# Patient Record
Sex: Male | Born: 2000
Health system: Southern US, Community
[De-identification: ages and names within clinical notes are randomized; demographics above are authoritative.]

## PROBLEM LIST (undated history)

## (undated) DIAGNOSIS — F909 Attention-deficit hyperactivity disorder, unspecified type: Secondary | ICD-10-CM

## (undated) HISTORY — PX: TUMOR REMOVAL: SHX12

---
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
## 2020-11-18 ENCOUNTER — Other Ambulatory Visit (HOSPITAL_COMMUNITY): Payer: Self-pay | Admitting: Family Medicine

## 2020-11-18 MED FILL — metroNIDAZOLE 500 MG TABS: 500 | 1 days supply | Qty: 4 | Fill #0

## 2021-01-18 ENCOUNTER — Observation Stay (HOSPITAL_COMMUNITY)
Admission: EM | Admit: 2021-01-18 | Discharge: 2021-01-20 | Disposition: A | Discharge status: Home / Self Care | Payer: Medicaid Other | Attending: General Surgery | Admitting: General Surgery

## 2021-01-18 ENCOUNTER — Other Ambulatory Visit: Payer: Self-pay

## 2021-01-18 ENCOUNTER — Encounter (HOSPITAL_COMMUNITY): Payer: Self-pay

## 2021-01-18 ENCOUNTER — Emergency Department (HOSPITAL_COMMUNITY): Payer: Medicaid Other

## 2021-01-18 DIAGNOSIS — S41132A Puncture wound without foreign body of left upper arm, initial encounter: Principal | ICD-10-CM | POA: Insufficient documentation

## 2021-01-18 DIAGNOSIS — Z20822 Contact with and (suspected) exposure to covid-19: Secondary | ICD-10-CM | POA: Insufficient documentation

## 2021-01-18 DIAGNOSIS — Z23 Encounter for immunization: Secondary | ICD-10-CM | POA: Diagnosis not present

## 2021-01-18 DIAGNOSIS — S31133A Puncture wound of abdominal wall without foreign body, right lower quadrant without penetration into peritoneal cavity, initial encounter: Secondary | ICD-10-CM | POA: Diagnosis not present

## 2021-01-18 DIAGNOSIS — S3289XA Fracture of other parts of pelvis, initial encounter for closed fracture: Secondary | ICD-10-CM | POA: Insufficient documentation

## 2021-01-18 DIAGNOSIS — Y900 Blood alcohol level of less than 20 mg/100 ml: Secondary | ICD-10-CM | POA: Diagnosis not present

## 2021-01-18 DIAGNOSIS — W3400XA Accidental discharge from unspecified firearms or gun, initial encounter: Secondary | ICD-10-CM | POA: Diagnosis not present

## 2021-01-18 DIAGNOSIS — T1490XA Injury, unspecified, initial encounter: Secondary | ICD-10-CM

## 2021-01-18 LAB — CBC
HCT: 38.8 % — ABNORMAL LOW (ref 39.0–52.0)
Hemoglobin: 11.9 g/dL — ABNORMAL LOW (ref 13.0–17.0)
MCH: 23.5 pg — ABNORMAL LOW (ref 26.0–34.0)
MCHC: 30.7 g/dL (ref 30.0–36.0)
MCV: 76.5 fL — ABNORMAL LOW (ref 80.0–100.0)
Platelets: 235 10*3/uL (ref 150–400)
RBC: 5.07 MIL/uL (ref 4.22–5.81)
RDW: 16.6 % — ABNORMAL HIGH (ref 11.5–15.5)
WBC: 14 10*3/uL — ABNORMAL HIGH (ref 4.0–10.5)
nRBC: 0 % (ref 0.0–0.2)

## 2021-01-18 LAB — URINALYSIS, ROUTINE W REFLEX MICROSCOPIC
Bilirubin Urine: NEGATIVE
Glucose, UA: NEGATIVE mg/dL
Hgb urine dipstick: NEGATIVE
Ketones, ur: NEGATIVE mg/dL
Nitrite: NEGATIVE
Protein, ur: 100 mg/dL — AB
Specific Gravity, Urine: >1.046 — ABNORMAL HIGH (ref 1.005–1.030)
pH: 8 (ref 5.0–8.0)

## 2021-01-18 LAB — COMPREHENSIVE METABOLIC PANEL
ALT: 14 U/L (ref 0–44)
AST: 33 U/L (ref 15–41)
Albumin: 3.8 g/dL (ref 3.5–5.0)
Alkaline Phosphatase: 67 U/L (ref 38–126)
Anion gap: 14 (ref 5–15)
BUN: 9 mg/dL (ref 6–20)
CO2: 19 mmol/L — ABNORMAL LOW (ref 22–32)
Calcium: 8.8 mg/dL — ABNORMAL LOW (ref 8.9–10.3)
Chloride: 109 mmol/L (ref 98–111)
Creatinine, Ser: 1.19 mg/dL (ref 0.61–1.24)
GFR, Estimated: >60 mL/min (ref >60)
Glucose, Bld: 145 mg/dL — ABNORMAL HIGH (ref 70–99)
Potassium: 3.9 mmol/L (ref 3.5–5.1)
Sodium: 142 mmol/L (ref 135–145)
Total Bilirubin: 1 mg/dL (ref 0.3–1.2)
Total Protein: 6.5 g/dL (ref 6.5–8.1)

## 2021-01-18 LAB — I-STAT CHEM 8, ED
BUN: 9 mg/dL (ref 6–20)
Calcium, Ion: 1.11 mmol/L — ABNORMAL LOW (ref 1.15–1.40)
Chloride: 107 mmol/L (ref 98–111)
Creatinine, Ser: 1 mg/dL (ref 0.61–1.24)
Glucose, Bld: 142 mg/dL — ABNORMAL HIGH (ref 70–99)
HCT: 40 % (ref 39.0–52.0)
Hemoglobin: 13.6 g/dL (ref 13.0–17.0)
Potassium: 4.1 mmol/L (ref 3.5–5.1)
Sodium: 144 mmol/L (ref 135–145)
TCO2: 21 mmol/L — ABNORMAL LOW (ref 22–32)

## 2021-01-18 LAB — PROTIME-INR
INR: 1.1 (ref 0.8–1.2)
Prothrombin Time: 14.3 seconds (ref 11.4–15.2)

## 2021-01-18 LAB — RESP PANEL BY RT-PCR (FLU A&B, COVID) ARPGX2
Influenza A by PCR: NEGATIVE
Influenza B by PCR: NEGATIVE
SARS Coronavirus 2 by RT PCR: NEGATIVE

## 2021-01-18 LAB — SAMPLE TO BLOOD BANK

## 2021-01-18 LAB — LACTIC ACID, PLASMA: Lactic Acid, Venous: 9.2 mmol/L (ref 0.5–1.9)

## 2021-01-18 LAB — ETHANOL: Alcohol, Ethyl (B): <10 mg/dL (ref <10)

## 2021-01-18 MED ORDER — LACTATED RINGERS IV BOLUS
1000.0000 mL | Freq: Once | INTRAVENOUS | Status: AC
  Administered 2021-01-18: 1000 mL via INTRAVENOUS

## 2021-01-18 MED ORDER — ONDANSETRON 4 MG PO TBDP: 4.0000 mg | ORAL_TABLET | Freq: Four times a day (QID) | ORAL | Status: DC | PRN

## 2021-01-18 MED ORDER — IOHEXOL 300 MG/ML  SOLN
100.0000 mL | Freq: Once | INTRAMUSCULAR | Status: AC | PRN
  Administered 2021-01-18: 100 mL via INTRAVENOUS

## 2021-01-18 MED ORDER — CEFAZOLIN SODIUM-DEXTROSE 2-4 GM/100ML-% IV SOLN
2.0000 g | Freq: Once | INTRAVENOUS | Status: AC
  Administered 2021-01-18: 2 g via INTRAVENOUS
  Filled 2021-01-18: qty 100

## 2021-01-18 MED ORDER — OXYCODONE HCL 5 MG PO TABS
5.0000 mg | ORAL_TABLET | Freq: Four times a day (QID) | ORAL | Status: DC | PRN
  Administered 2021-01-18 – 2021-01-20 (×4): 10 mg via ORAL
  Filled 2021-01-18 (×4): qty 2

## 2021-01-18 MED ORDER — MORPHINE SULFATE (PF) 2 MG/ML IV SOLN: 1.0000 mg | INTRAVENOUS | Status: DC | PRN

## 2021-01-18 MED ORDER — FENTANYL CITRATE (PF) 100 MCG/2ML IJ SOLN
50.0000 ug | Freq: Once | INTRAMUSCULAR | Status: AC
  Administered 2021-01-18: 50 ug via INTRAVENOUS
  Filled 2021-01-18: qty 2

## 2021-01-18 MED ORDER — LACTATED RINGERS IV SOLN: INTRAVENOUS | Status: DC

## 2021-01-18 MED ORDER — ENOXAPARIN SODIUM 30 MG/0.3ML IJ SOSY
30.0000 mg | PREFILLED_SYRINGE | Freq: Two times a day (BID) | INTRAMUSCULAR | Status: DC
  Administered 2021-01-20: 30 mg via SUBCUTANEOUS
  Filled 2021-01-18: qty 0.3

## 2021-01-18 MED ORDER — TETANUS-DIPHTH-ACELL PERTUSSIS 5-2.5-18.5 LF-MCG/0.5 IM SUSY
0.5000 mL | PREFILLED_SYRINGE | Freq: Once | INTRAMUSCULAR | Status: AC
  Administered 2021-01-18: 0.5 mL via INTRAMUSCULAR
  Filled 2021-01-18: qty 0.5

## 2021-01-18 MED ORDER — DOCUSATE SODIUM 100 MG PO CAPS
100.0000 mg | ORAL_CAPSULE | Freq: Two times a day (BID) | ORAL | Status: DC
  Administered 2021-01-19 – 2021-01-20 (×3): 100 mg via ORAL
  Filled 2021-01-18 (×3): qty 1

## 2021-01-18 MED ORDER — ACETAMINOPHEN 325 MG PO TABS: 650.0000 mg | ORAL_TABLET | ORAL | Status: DC | PRN

## 2021-01-18 MED ORDER — ONDANSETRON HCL 4 MG/2ML IJ SOLN
4.0000 mg | Freq: Four times a day (QID) | INTRAMUSCULAR | Status: DC | PRN
  Filled 2021-01-18: qty 2
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
## 2021-01-18 NOTE — ED Notes (Signed)
Pt back from CT
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
## 2021-01-18 NOTE — ED Notes (Signed)
Pt has metal necklace in urine cup at bedside.

## 2021-01-18 NOTE — TOC CAGE-AID Note (Signed)
Transition of Care [facility]) - CAGE-AID Screening   Patient Details  Name: Ian [name] MRN: [id number] Date of Birth: May 10, 2001  Clinical Narrative:  Patient states he does use marijuana most days but denies any current alcohol use. He states he does not need any substance abuse resources at this time.  CAGE-AID Screening:    Have You Ever Felt You Ought to Cut Down on Your Drinking or Drug Use?: No Have People Annoyed You By Critizing Your Drinking Or Drug Use?: No Have You Felt Bad Or Guilty About Your Drinking Or Drug Use?: No Have You Ever Had a Drink or Used Drugs First Thing In The Morning to Steady Your Nerves or to Get Rid of a Hangover?: No CAGE-AID Score: 0  Substance Abuse Education Offered: Yes (patient denies need for education)
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
## 2021-01-19 LAB — CBC
HCT: 34.2 % — ABNORMAL LOW (ref 39.0–52.0)
HCT: 31.3 % — ABNORMAL LOW (ref 39.0–52.0)
Hemoglobin: 10.7 g/dL — ABNORMAL LOW (ref 13.0–17.0)
Hemoglobin: 9.9 g/dL — ABNORMAL LOW (ref 13.0–17.0)
MCH: 23.3 pg — ABNORMAL LOW (ref 26.0–34.0)
MCH: 23.6 pg — ABNORMAL LOW (ref 26.0–34.0)
MCHC: 31.3 g/dL (ref 30.0–36.0)
MCHC: 31.6 g/dL (ref 30.0–36.0)
MCV: 74.5 fL — ABNORMAL LOW (ref 80.0–100.0)
MCV: 74.7 fL — ABNORMAL LOW (ref 80.0–100.0)
Platelets: 188 10*3/uL (ref 150–400)
Platelets: 167 10*3/uL (ref 150–400)
RBC: 4.59 MIL/uL (ref 4.22–5.81)
RBC: 4.19 MIL/uL — ABNORMAL LOW (ref 4.22–5.81)
RDW: 16 % — ABNORMAL HIGH (ref 11.5–15.5)
RDW: 15.9 % — ABNORMAL HIGH (ref 11.5–15.5)
WBC: 15.1 10*3/uL — ABNORMAL HIGH (ref 4.0–10.5)
WBC: 10.8 10*3/uL — ABNORMAL HIGH (ref 4.0–10.5)
nRBC: 0 % (ref 0.0–0.2)
nRBC: 0 % (ref 0.0–0.2)

## 2021-01-19 LAB — BASIC METABOLIC PANEL
Anion gap: 8 (ref 5–15)
BUN: 7 mg/dL (ref 6–20)
CO2: 28 mmol/L (ref 22–32)
Calcium: 8.7 mg/dL — ABNORMAL LOW (ref 8.9–10.3)
Chloride: 105 mmol/L (ref 98–111)
Creatinine, Ser: 0.86 mg/dL (ref 0.61–1.24)
GFR, Estimated: >60 mL/min (ref >60)
Glucose, Bld: 127 mg/dL — ABNORMAL HIGH (ref 70–99)
Potassium: 3.6 mmol/L (ref 3.5–5.1)
Sodium: 141 mmol/L (ref 135–145)

## 2021-01-19 LAB — HIV ANTIBODY (ROUTINE TESTING W REFLEX): HIV Screen 4th Generation wRfx: NONREACTIVE

## 2021-01-19 MED ORDER — SODIUM CHLORIDE 0.9 % IV SOLN: INTRAVENOUS | Status: DC

## 2021-01-19 MED ORDER — ACETAMINOPHEN 500 MG PO TABS
1000.0000 mg | ORAL_TABLET | Freq: Four times a day (QID) | ORAL | Status: DC
  Administered 2021-01-19 – 2021-01-20 (×4): 1000 mg via ORAL
  Filled 2021-01-19 (×4): qty 2

## 2021-01-19 MED ORDER — POTASSIUM CHLORIDE IN NACL 20-0.9 MEQ/L-% IV SOLN
INTRAVENOUS | Status: DC
  Filled 2021-01-19 (×2): qty 1000
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
## 2021-01-19 NOTE — Progress Notes (Signed)
Attempted PIV x 2 with no success. Large veins with NBR. Pt states he is receiving oral pain meds. Abx looks completed on the [facility] with no IVF ordered. Spoke with primary RN and explained the pt has no IV meds ordered ,and to place a consult if the need for PIV arises.

## 2021-01-19 NOTE — Plan of Care (Signed)
  Problem: Health Behavior/Discharge Planning: Goal: Ability to manage health-related needs will improve Outcome: Progressing   Problem: Activity: Goal: Risk for activity intolerance will decrease Outcome: Progressing   Problem: Skin Integrity: Goal: Risk for impaired skin integrity will decrease Outcome: Progressing   
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
## 2021-01-19 NOTE — Plan of Care (Signed)

## 2021-01-19 NOTE — Plan of Care (Signed)
  Problem: Activity: Goal: Risk for activity intolerance will decrease Outcome: Progressing   Problem: Coping: Goal: Level of anxiety will decrease Outcome: Progressing   Problem: Safety: Goal: Ability to remain free from injury will improve Outcome: Progressing   Problem: Skin Integrity: Goal: Risk for impaired skin integrity will decrease Outcome: Progressing   

## 2021-01-20 LAB — CBC
HCT: 32.6 % — ABNORMAL LOW (ref 39.0–52.0)
Hemoglobin: 10.3 g/dL — ABNORMAL LOW (ref 13.0–17.0)
MCH: 23.4 pg — ABNORMAL LOW (ref 26.0–34.0)
MCHC: 31.6 g/dL (ref 30.0–36.0)
MCV: 74.1 fL — ABNORMAL LOW (ref 80.0–100.0)
Platelets: 150 10*3/uL (ref 150–400)
RBC: 4.4 MIL/uL (ref 4.22–5.81)
RDW: 15.9 % — ABNORMAL HIGH (ref 11.5–15.5)
WBC: 9.8 10*3/uL (ref 4.0–10.5)
nRBC: 0 % (ref 0.0–0.2)

## 2021-01-20 LAB — BASIC METABOLIC PANEL
Anion gap: 9 (ref 5–15)
BUN: 6 mg/dL (ref 6–20)
CO2: 26 mmol/L (ref 22–32)
Calcium: 8.4 mg/dL — ABNORMAL LOW (ref 8.9–10.3)
Chloride: 104 mmol/L (ref 98–111)
Creatinine, Ser: 0.65 mg/dL (ref 0.61–1.24)
GFR, Estimated: >60 mL/min (ref >60)
Glucose, Bld: 83 mg/dL (ref 70–99)
Potassium: 3.7 mmol/L (ref 3.5–5.1)
Sodium: 139 mmol/L (ref 135–145)

## 2021-01-20 MED ORDER — OXYCODONE HCL 10 MG PO TABS: 10.0000 mg | ORAL_TABLET | Freq: Four times a day (QID) | ORAL | 0 refills | Status: AC | PRN

## 2021-01-20 MED ORDER — DOCUSATE SODIUM 100 MG PO CAPS: 100.0000 mg | ORAL_CAPSULE | Freq: Two times a day (BID) | ORAL | 0 refills | Status: AC

## 2021-01-20 MED ORDER — ACETAMINOPHEN 500 MG PO TABS: 1000.0000 mg | ORAL_TABLET | Freq: Four times a day (QID) | ORAL | 0 refills | Status: AC
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
## 2021-01-20 NOTE — Plan of Care (Signed)
  Problem: Health Behavior/Discharge Planning: Goal: Ability to manage health-related needs will improve Outcome: Progressing   Problem: Activity: Goal: Risk for activity intolerance will decrease Outcome: Progressing   Problem: Safety: Goal: Ability to remain free from injury will improve Outcome: Progressing   

## 2021-01-20 NOTE — Progress Notes (Signed)
RN went over discharge instructions with pt, he verbalized understanding, no questions. Follow up appointment on paperwork. Paperwork, script and pt necklace given to [employer]. Pt received crutches to take with him. Pt discharging under [employer] custody.

## 2021-01-20 NOTE — Progress Notes (Signed)
Orthopedic Tech Progress Note Patient Details:  Ian [name] 17-Sep-2001 [id number]  Ortho Devices Type of Ortho Device: Crutches Ortho Device/Splint Interventions: Application,Ordered,Adjustment   Post Interventions Patient Tolerated: Well,Ambulated well Instructions Provided: Care of device   [name] 01/20/2021, 12:22 PM
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
